# Patient Record
Sex: Female | Born: 1995 | Race: Black or African American | Hispanic: No | Marital: Single | Smoking: Current every day smoker
Health system: Southern US, Community
[De-identification: ages and names within clinical notes are randomized; demographics above are authoritative.]

---

## 2015-11-28 ENCOUNTER — Ambulatory Visit
Admission: RE | Admit: 2015-11-28 | Discharge: 2015-11-28 | Disposition: A | Discharge status: Home / Self Care | Payer: Managed Care, Other (non HMO) | Source: Ambulatory Visit | Attending: Emergency Medicine | Admitting: Emergency Medicine

## 2015-11-28 ENCOUNTER — Other Ambulatory Visit: Payer: Self-pay | Admitting: Emergency Medicine

## 2015-11-28 DIAGNOSIS — M79644 Pain in right finger(s): Secondary | ICD-10-CM

## 2016-06-08 ENCOUNTER — Ambulatory Visit (HOSPITAL_COMMUNITY)
Admission: EM | Admit: 2016-06-08 | Discharge: 2016-06-08 | Disposition: A | Discharge status: Home / Self Care | Payer: 59 | Attending: Physician Assistant | Admitting: Physician Assistant

## 2016-06-08 ENCOUNTER — Encounter (HOSPITAL_COMMUNITY): Payer: Self-pay | Admitting: Emergency Medicine

## 2016-06-08 DIAGNOSIS — N760 Acute vaginitis: Secondary | ICD-10-CM | POA: Diagnosis not present

## 2016-06-08 DIAGNOSIS — R079 Chest pain, unspecified: Secondary | ICD-10-CM | POA: Diagnosis present

## 2016-06-08 DIAGNOSIS — J219 Acute bronchiolitis, unspecified: Secondary | ICD-10-CM

## 2016-06-08 DIAGNOSIS — B9689 Other specified bacterial agents as the cause of diseases classified elsewhere: Secondary | ICD-10-CM

## 2016-06-08 DIAGNOSIS — R05 Cough: Secondary | ICD-10-CM | POA: Diagnosis present

## 2016-06-08 MED ORDER — METRONIDAZOLE 500 MG PO TABS: 500.0000 mg | ORAL_TABLET | Freq: Two times a day (BID) | ORAL | 0 refills | Status: DC

## 2016-06-08 MED ORDER — ALBUTEROL SULFATE HFA 108 (90 BASE) MCG/ACT IN AERS: 1.0000 | INHALATION_SPRAY | Freq: Four times a day (QID) | RESPIRATORY_TRACT | 0 refills | Status: AC | PRN

## 2016-06-08 NOTE — Discharge Instructions (Signed)
LIMIT SMOKING  PUSH FLUIDS  DIET AND ACTIVITY AS TOLERATED.

## 2016-06-08 NOTE — ED Triage Notes (Signed)
Pt. Stateed, I've had a cough with bronchitis for 4 months and a vagina  Discharge since I was 11 . I have a thickness and odor.

## 2016-06-09 LAB — CERVICOVAGINAL ANCILLARY ONLY
Chlamydia: NEGATIVE
Neisseria Gonorrhea: NEGATIVE

## 2016-06-10 LAB — CERVICOVAGINAL ANCILLARY ONLY: Wet Prep (BD Affirm): POSITIVE — AB

## 2016-06-12 ENCOUNTER — Telehealth (HOSPITAL_COMMUNITY): Payer: Self-pay | Admitting: *Deleted

## 2016-06-13 NOTE — ED Provider Notes (Signed)
CSN: [id number]     Arrival date & time 06/08/16  1518 History   First MD Initiated Contact with Patient 06/08/16 1552     Chief Complaint  Patient presents with  . Chest Pain  . Cough   (Consider location/radiation/quality/duration/timing/severity/associated sxs/prior Treatment) HPI 35 /o female with 3 day hx of vag discharge, fishy odor. Itching, no pain, no change in sexual partners. History reviewed. No pertinent past medical history. History reviewed. No pertinent surgical history. No family history on file. Social History  Substance Use Topics  . Smoking status: Current Every Day Smoker  . Smokeless tobacco: Current User  . Alcohol use Yes   OB History    No data available     Review of Systems  Denies: HEADACHE, NAUSEA, ABDOMINAL PAIN, CHEST PAIN, CONGESTION, DYSURIA, SHORTNESS OF BREATH  Allergies  Review of patient's allergies indicates not on file.  Home Medications   Prior to Admission medications   Medication Sig Start Date End Date Taking? Authorizing Provider  albuterol (PROVENTIL HFA;VENTOLIN HFA) 108 (90 Base) MCG/ACT inhaler Inhale 1-2 puffs into the lungs every 6 (six) hours as needed for wheezing or shortness of breath. 06/08/16   Tharon Aquas, PA  metroNIDAZOLE (FLAGYL) 500 MG tablet Take 1 tablet (500 mg total) by mouth 2 (two) times daily. 06/08/16   Tharon Aquas, PA   Meds Ordered and Administered this Visit  Medications - No data to display  BP 100/69 (BP Location: Left Arm)   Pulse 102   Temp 98.1 F (36.7 C) (Oral)   Resp 17   Ht 5' 5.5" (1.664 m)   Wt 117 lb (53.1 kg)   LMP 05/25/2016   SpO2 98%   BMI 19.17 kg/m  No data found.   Physical Exam NURSES NOTES AND VITAL SIGNS REVIEWED. CONSTITUTIONAL: Well developed, well nourished, no acute distress HEENT: normocephalic, atraumatic EYES: Conjunctiva normal NECK:normal ROM, supple, no adenopathy PULMONARY:No respiratory distress, normal effort ABDOMINAL: Soft, ND, NT BS+, No  CVAT MUSCULOSKELETAL: Normal ROM of all extremities,  SKIN: warm and dry without rash PSYCHIATRIC: Mood and affect, behavior are normal NURSES NOTES AND VITAL SIGNS REVIEWED. CONSTITUTIONAL: Well developed, well nourished, no acute distress HEENT: normocephalic, atraumatic, right and left TM's are normal EYES: Conjunctiva normal NECK:normal ROM, supple, no adenopathy PULMONARY:No respiratory distress, normal effort Exam is performed with patient's permission Female nursing staff present to chaperone.  Perineum: clean, dry without lesions, no groin or inguinal Lymphadenopathy; urethra . No caruncle or prolapse noted.  Vaginal canal: moderate amount thick white malodorous  adherent discharge noted in canal with loss of rugae. Scant amount thin homogenous white-yellow discharge in fornix.  Cervix is pink and non-friable.      Urgent Care Course   Clinical Course    Procedures (including critical care time)  Labs Review Labs Reviewed  CERVICOVAGINAL ANCILLARY ONLY - Abnormal; Notable for the following:       Result Value   Wet Prep (BD Affirm) **POSITIVE for [place]rella** (*)    All other components within normal limits  CERVICOVAGINAL ANCILLARY ONLY    Imaging Review No results found.   Visual Acuity Review  Right Eye Distance:   Left Eye Distance:   Bilateral Distance:    Right Eye Near:   Left Eye Near:    Bilateral Near:         MDM   1. BV (bacterial vaginosis)   2. Acute bronchiolitis with bronchospasm     Patient is reassured that there  are no issues that require transfer to higher level of care at this time or additional tests. Patient is advised to continue home symptomatic treatment. Patient is advised that if there are new or worsening symptoms to attend the emergency department, contact primary care provider, or return to UC. Instructions of care provided discharged home in stable condition.    THIS NOTE WAS GENERATED USING A VOICE RECOGNITION  SOFTWARE PROGRAM. ALL REASONABLE EFFORTS  WERE MADE TO PROOFREAD THIS DOCUMENT FOR ACCURACY.  I have verbally reviewed the discharge instructions with the patient. A printed AVS was given to the patient.  All questions were answered prior to discharge.      Tharon Aquas, PA 06/13/16 2137

## 2017-01-14 ENCOUNTER — Emergency Department (HOSPITAL_COMMUNITY)
Admission: EM | Admit: 2017-01-14 | Discharge: 2017-01-14 | Disposition: A | Discharge status: Home / Self Care | Payer: 59 | Attending: Emergency Medicine | Admitting: Emergency Medicine

## 2017-01-14 ENCOUNTER — Encounter (HOSPITAL_COMMUNITY): Payer: Self-pay | Admitting: *Deleted

## 2017-01-14 DIAGNOSIS — R102 Pelvic and perineal pain: Secondary | ICD-10-CM | POA: Diagnosis not present

## 2017-01-14 DIAGNOSIS — Z79899 Other long term (current) drug therapy: Secondary | ICD-10-CM | POA: Diagnosis not present

## 2017-01-14 DIAGNOSIS — F17200 Nicotine dependence, unspecified, uncomplicated: Secondary | ICD-10-CM | POA: Insufficient documentation

## 2017-01-14 DIAGNOSIS — N946 Dysmenorrhea, unspecified: Secondary | ICD-10-CM | POA: Insufficient documentation

## 2017-01-14 DIAGNOSIS — N939 Abnormal uterine and vaginal bleeding, unspecified: Secondary | ICD-10-CM | POA: Diagnosis present

## 2017-01-14 LAB — COMPREHENSIVE METABOLIC PANEL
ALBUMIN: 5 g/dL (ref 3.5–5.0)
ALT: 19 U/L (ref 14–54)
ANION GAP: 12 (ref 5–15)
AST: 28 U/L (ref 15–41)
Alkaline Phosphatase: 42 U/L (ref 38–126)
BUN: 13 mg/dL (ref 6–20)
CHLORIDE: 106 mmol/L (ref 101–111)
CO2: 21 mmol/L — ABNORMAL LOW (ref 22–32)
Calcium: 10.1 mg/dL (ref 8.9–10.3)
Creatinine, Ser: 0.72 mg/dL (ref 0.44–1.00)
GFR calc Af Amer: >60 mL/min (ref >60)
GLUCOSE: 105 mg/dL — AB (ref 65–99)
POTASSIUM: 3.8 mmol/L (ref 3.5–5.1)
Sodium: 139 mmol/L (ref 135–145)
TOTAL PROTEIN: 8.6 g/dL — AB (ref 6.5–8.1)
Total Bilirubin: 0.9 mg/dL (ref 0.3–1.2)

## 2017-01-14 LAB — I-STAT BETA HCG BLOOD, ED (MC, WL, AP ONLY)

## 2017-01-14 LAB — URINALYSIS, ROUTINE W REFLEX MICROSCOPIC
BILIRUBIN URINE: NEGATIVE
Bacteria, UA: NONE SEEN
GLUCOSE, UA: NEGATIVE mg/dL
Ketones, ur: 80 mg/dL — AB
LEUKOCYTES UA: NEGATIVE
NITRITE: NEGATIVE
Protein, ur: NEGATIVE mg/dL
SPECIFIC GRAVITY, URINE: 1.028 (ref 1.005–1.030)
Squamous Epithelial / LPF: NONE SEEN
pH: 5 (ref 5.0–8.0)

## 2017-01-14 LAB — CBC
HCT: 39.8 % (ref 36.0–46.0)
Hemoglobin: 13.1 g/dL (ref 12.0–15.0)
MCH: 23.6 pg — ABNORMAL LOW (ref 26.0–34.0)
MCHC: 32.9 g/dL (ref 30.0–36.0)
MCV: 71.6 fL — ABNORMAL LOW (ref 78.0–100.0)
PLATELETS: 267 10*3/uL (ref 150–400)
RBC: 5.56 MIL/uL — ABNORMAL HIGH (ref 3.87–5.11)
RDW: 13.6 % (ref 11.5–15.5)
WBC: 10.2 10*3/uL (ref 4.0–10.5)

## 2017-01-14 MED ORDER — IBUPROFEN 400 MG PO TABS: 400.0000 mg | ORAL_TABLET | Freq: Three times a day (TID) | ORAL | 0 refills | Status: AC | PRN

## 2017-01-14 MED ORDER — ONDANSETRON HCL 4 MG/2ML IJ SOLN
4.0000 mg | Freq: Once | INTRAMUSCULAR | Status: AC
  Administered 2017-01-14: 4 mg via INTRAVENOUS
  Filled 2017-01-14: qty 2

## 2017-01-14 MED ORDER — HYDROCODONE-ACETAMINOPHEN 5-325 MG PO TABS: 1.0000 | ORAL_TABLET | ORAL | 0 refills | Status: AC | PRN

## 2017-01-14 MED ORDER — OXYCODONE-ACETAMINOPHEN 5-325 MG PO TABS
1.0000 | ORAL_TABLET | Freq: Once | ORAL | Status: AC
  Administered 2017-01-14: 1 via ORAL
  Filled 2017-01-14: qty 1

## 2017-01-14 MED ORDER — SODIUM CHLORIDE 0.9 % IV BOLUS (SEPSIS)
1000.0000 mL | Freq: Once | INTRAVENOUS | Status: AC
  Administered 2017-01-14: 1000 mL via INTRAVENOUS

## 2017-01-14 MED ORDER — MORPHINE SULFATE (PF) 4 MG/ML IV SOLN
4.0000 mg | Freq: Once | INTRAVENOUS | Status: AC
  Administered 2017-01-14: 4 mg via INTRAVENOUS
  Filled 2017-01-14: qty 1

## 2017-01-14 MED ORDER — ONDANSETRON 8 MG PO TBDP: 8.0000 mg | ORAL_TABLET | Freq: Three times a day (TID) | ORAL | 0 refills | Status: AC | PRN

## 2017-01-14 NOTE — Discharge Instructions (Signed)
Please call the gynecologist for follow-up

## 2017-01-14 NOTE — ED Notes (Signed)
Patient still vomiting after zofran MD notified

## 2017-01-14 NOTE — ED Provider Notes (Signed)
[facility]-EMERGENCY DEPT Provider Note   CSN: [id number] Arrival date & time: 01/14/17  [id number]     History   Chief Complaint Chief Complaint  Patient presents with  . Abdominal Pain    HPI Taylor Harmon is a 21 y.o. female.  HPI Patient presents complaining of severe lower abdominal menstrual cramping and pain.  This pain began yesterday with heavy vaginal bleeding.  She states she always has pain with her menses like this.  She reports some nausea and vomiting today with occasional blood streaking in her vomit.  Her vomit is otherwise been stomach contents.  Denies diarrhea.  No dysuria or urinary frequency.  No fevers or chills.   History reviewed. No pertinent past medical history.  There are no active problems to display for this patient.   History reviewed. No pertinent surgical history.  OB History    No data available       Home Medications    Prior to Admission medications   Medication Sig Start Date End Date Taking? Authorizing Provider  albuterol (PROVENTIL HFA;VENTOLIN HFA) 108 (90 Base) MCG/ACT inhaler Inhale 1-2 puffs into the lungs every 6 (six) hours as needed for wheezing or shortness of breath. 06/08/16  Yes Tharon AquasFrank C Patrick, PA    Family History History reviewed. No pertinent family history.  Social History Social History  Substance Use Topics  . Smoking status: Current Every Day Smoker  . Smokeless tobacco: Current User  . Alcohol use Yes     Allergies   Patient has no known allergies.   Review of Systems Review of Systems  All other systems reviewed and are negative.    Physical Exam Updated Vital Signs BP 129/92 (BP Location: Left Arm)   Pulse 97   Temp 97.9 F (36.6 C) (Oral)   Resp 18   Ht 5' 5.5" (1.664 m)   Wt 115 lb (52.2 kg)   LMP 01/13/2017   SpO2 99%   BMI 18.85 kg/m   Physical Exam  Constitutional: She is oriented to person, place, and time. She appears well-developed and well-nourished. No distress.  Uncomfortable  appearing  HENT:  Head: Normocephalic and atraumatic.  Eyes: EOM are normal.  Neck: Normal range of motion.  Cardiovascular: Regular rhythm and normal heart sounds.   Pulmonary/Chest: Effort normal and breath sounds normal.  Abdominal: Soft. She exhibits no distension. There is no tenderness.  Musculoskeletal: Normal range of motion.  Neurological: She is alert and oriented to person, place, and time.  Skin: Skin is warm and dry.  Psychiatric: She has a normal mood and affect. Judgment normal.  Nursing note and vitals reviewed.    ED Treatments / Results  Labs (all labs ordered are listed, but only abnormal results are displayed) Labs Reviewed  CBC - Abnormal; Notable for the following:       Result Value   RBC 5.56 (*)    MCV 71.6 (*)    MCH 23.6 (*)    All other components within normal limits  COMPREHENSIVE METABOLIC PANEL - Abnormal; Notable for the following:    CO2 21 (*)    Glucose, Bld 105 (*)    Total Protein 8.6 (*)    All other components within normal limits  URINALYSIS, ROUTINE W REFLEX MICROSCOPIC - Abnormal; Notable for the following:    Hgb urine dipstick SMALL (*)    Ketones, ur 80 (*)    All other components within normal limits  I-STAT BETA HCG BLOOD, ED ([facility], [facility], [facility] ONLY)  EKG  EKG Interpretation None       Radiology No results found.  Procedures Procedures (including critical care time)  Medications Ordered in ED Medications  oxyCODONE-acetaminophen (PERCOCET/ROXICET) 5-325 MG per tablet 1 tablet (not administered)  sodium chloride 0.9 % bolus 1,000 mL (0 mLs Intravenous Stopped 01/14/17 1143)  ondansetron (ZOFRAN) injection 4 mg (4 mg Intravenous Given 01/14/17 0937)  morphine 4 MG/ML injection 4 mg (4 mg Intravenous Given 01/14/17 1021)     Initial Impression / Assessment and Plan / ED Course  I have reviewed the triage vital signs and the nursing notes.  Pertinent labs & imaging results that were available during my care of the  patient were reviewed by me and considered in my medical decision making (see chart for details).     12:39 PM Patient feels better at this time.  Outpatient GYN follow-up.  No indication for imaging.  Abdominal exam without tenderness.  Discharge home with outpatient anti-medics and pain medication.    Final Clinical Impressions(s) / ED Diagnoses   Final diagnoses:  Pelvic pain in female  Dysmenorrhea    New Prescriptions New Prescriptions   HYDROCODONE-ACETAMINOPHEN (NORCO/VICODIN) 5-325 MG TABLET    Take 1 tablet by mouth every 4 (four) hours as needed for moderate pain.   IBUPROFEN (ADVIL,MOTRIN) 400 MG TABLET    Take 1 tablet (400 mg total) by mouth every 8 (eight) hours as needed.   ONDANSETRON (ZOFRAN ODT) 8 MG DISINTEGRATING TABLET    Take 1 tablet (8 mg total) by mouth every 8 (eight) hours as needed for nausea or vomiting.     Azalia Bilis, MD 01/14/17 1239

## 2017-01-14 NOTE — ED Triage Notes (Signed)
Pt reports having severe menstrual cramping since yesterday with heavy vaginal bleeding and n/v. Reports now vomiting blood. States she always has heavy cramping and bleeding but this is more severe.

## 2017-01-14 NOTE — ED Notes (Signed)
Line started and zofran given but patient still actively vomiting
